# Patient Record
Sex: Male | Born: 1995 | Race: White | Hispanic: No | Marital: Single | Smoking: Never smoker
Health system: Southern US, Community
[De-identification: ages and names within clinical notes are randomized; demographics above are authoritative.]

## PROBLEM LIST (undated history)

## (undated) DIAGNOSIS — G43909 Migraine, unspecified, not intractable, without status migrainosus: Secondary | ICD-10-CM

---

## 2014-05-21 ENCOUNTER — Encounter (HOSPITAL_COMMUNITY): Payer: Self-pay | Admitting: Emergency Medicine

## 2014-05-21 ENCOUNTER — Emergency Department (HOSPITAL_COMMUNITY)
Admission: EM | Admit: 2014-05-21 | Discharge: 2014-05-21 | Disposition: A | Discharge status: Home / Self Care | Payer: Medicaid - Out of State | Attending: Emergency Medicine | Admitting: Emergency Medicine

## 2014-05-21 DIAGNOSIS — G43909 Migraine, unspecified, not intractable, without status migrainosus: Secondary | ICD-10-CM | POA: Insufficient documentation

## 2014-05-21 DIAGNOSIS — R519 Headache, unspecified: Secondary | ICD-10-CM

## 2014-05-21 DIAGNOSIS — R51 Headache: Secondary | ICD-10-CM

## 2014-05-21 HISTORY — DX: Migraine, unspecified, not intractable, without status migrainosus: G43.909

## 2014-05-21 MED ORDER — GI COCKTAIL ~~LOC~~
30.0000 mL | Freq: Once | ORAL | Status: AC
  Administered 2014-05-21: 30 mL via ORAL
  Filled 2014-05-21: qty 30

## 2014-05-21 MED ORDER — DEXAMETHASONE SODIUM PHOSPHATE 10 MG/ML IJ SOLN
10.0000 mg | Freq: Once | INTRAMUSCULAR | Status: AC
Start: 2014-05-21 — End: 2014-05-21
  Administered 2014-05-21: 10 mg via INTRAVENOUS
  Filled 2014-05-21: qty 1

## 2014-05-21 MED ORDER — SUMATRIPTAN SUCCINATE 50 MG PO TABS: 50.0000 mg | ORAL_TABLET | ORAL | Status: AC | PRN

## 2014-05-21 MED ORDER — GI COCKTAIL ~~LOC~~: 15.0000 mL | Freq: Three times a day (TID) | ORAL | Status: AC | PRN

## 2014-05-21 MED ORDER — SODIUM CHLORIDE 0.9 % IV BOLUS (SEPSIS)
1000.0000 mL | Freq: Once | INTRAVENOUS | Status: AC
Start: 2014-05-21 — End: 2014-05-21
  Administered 2014-05-21: 1000 mL via INTRAVENOUS

## 2014-05-21 MED ORDER — METOCLOPRAMIDE HCL 10 MG PO TABS: 10.0000 mg | ORAL_TABLET | Freq: Four times a day (QID) | ORAL | Status: AC

## 2014-05-21 MED ORDER — METOCLOPRAMIDE HCL 5 MG/ML IJ SOLN
10.0000 mg | Freq: Once | INTRAMUSCULAR | Status: AC
  Administered 2014-05-21: 10 mg via INTRAVENOUS
  Filled 2014-05-21: qty 2

## 2014-05-21 MED ORDER — DIPHENHYDRAMINE HCL 50 MG/ML IJ SOLN
25.0000 mg | Freq: Once | INTRAMUSCULAR | Status: AC
  Administered 2014-05-21: 25 mg via INTRAVENOUS
  Filled 2014-05-21: qty 1

## 2014-05-21 NOTE — Discharge Instructions (Signed)
Do not hesitate to return to the emergency room for any new, worsening or concerning symptoms.  Please obtain primary care using resource guide below. But the minute you were seen in the emergency room and that they will need to obtain records for further outpatient management.   General Headache Without Cause A headache is pain or discomfort felt around the head or neck area. The specific cause of a headache may not be found. There are many causes and types of headaches. A few common ones are:  Tension headaches.  Migraine headaches.  Cluster headaches.  Chronic daily headaches. HOME CARE INSTRUCTIONS   Keep all follow-up appointments with your caregiver or any specialist referral.  Only take over-the-counter or prescription medicines for pain or discomfort as directed by your caregiver.  Lie down in a dark, quiet room when you have a headache.  Keep a headache journal to find out what may trigger your migraine headaches. For example, write down:  What you eat and drink.  How much sleep you get.  Any change to your diet or medicines.  Try massage or other relaxation techniques.  Put ice packs or heat on the head and neck. Use these 3 to 4 times per day for 15 to 20 minutes each time, or as needed.  Limit stress.  Sit up straight, and do not tense your muscles.  Quit smoking if you smoke.  Limit alcohol use.  Decrease the amount of caffeine you drink, or stop drinking caffeine.  Eat and sleep on a regular schedule.  Get 7 to 9 hours of sleep, or as recommended by your caregiver.  Keep lights dim if bright lights bother you and make your headaches worse. SEEK MEDICAL CARE IF:   You have problems with the medicines you were prescribed.  Your medicines are not working.  You have a change from the usual headache.  You have nausea or vomiting. SEEK IMMEDIATE MEDICAL CARE IF:   Your headache becomes severe.  You have a fever.  You have a stiff neck.  You  have loss of vision.  You have muscular weakness or loss of muscle control.  You start losing your balance or have trouble walking.  You feel faint or pass out.  You have severe symptoms that are different from your first symptoms. MAKE SURE YOU:   Understand these instructions.  Will watch your condition.  Will get help right away if you are not doing well or get worse. Document Released: 03/23/2005 Document Revised: 06/15/2011 Document Reviewed: 04/08/2011 [facility] Patient Information 2015 [place], [place]. This information is not intended to replace advice given to you by your health care provider. Make sure you discuss any questions you have with your health care provider.  Emergency Department Resource Guide 1) Find a Doctor and Pay Out of Pocket Although you won't have to find out who is covered by your insurance plan, it is a good idea to ask around and get recommendations. You will then need to call the office and see if the doctor you have chosen will accept you as a new patient and what types of options they offer for patients who are self-pay. Some doctors offer discounts or will set up payment plans for their patients who do not have insurance, but you will need to ask so you aren't surprised when you get to your appointment.  2) Contact Your Local Health Department Not all health departments have doctors that can see patients for sick visits, but many do, so  it is worth a call to see if yours does. If you don't know where your local health department is, you can check in your phone book. The CDC also has a tool to help you locate your state's health department, and many state websites also have listings of all of their local health departments.  3) Find a Walk-in Clinic If your illness is not likely to be very severe or complicated, you may want to try a walk in clinic. These are popping up all over the country in pharmacies, drugstores, and shopping centers. They're usually  staffed by nurse practitioners or physician assistants that have been trained to treat common illnesses and complaints. They're usually fairly quick and inexpensive. However, if you have serious medical issues or chronic medical problems, these are probably not your best option.  No Primary Care Doctor: - Call Health Connect at  [phone] - they can help you locate a primary care doctor that  accepts your insurance, provides certain services, etc. - Physician Referral Service- [phone]  Chronic Pain Problems: Organization         Address  Phone   Notes  [facility]  [phone] Patients need to be referred by their primary care doctor.   Medication Assistance: Organization         Address  Phone   Notes  [facility] Medication [facility] [address] [place] [place], [place] [address] [phone] --Must be a resident of [facility] -- Must have NO insurance coverage whatsoever (no Medicaid/ Medicare, etc.) -- The pt. MUST have a primary care doctor that directs their care regularly and follows them in the community   [facility]ist  [phone]   [employer]  [phone]    Agencies that provide inexpensive medical care: Organization         Address  Phone   Notes  [facility]  [phone]   [facility]    [phone]   [facility] [address], [place] [address] [phone]   [facility] [address] [place] [phone]   Planned Parenthood    [phone]   [facility]    [phone]   Community Health and [facility]  [address], [place] Phone:  [phone], Fax:  [phone] Hours of Operation:  9 am - 6 pm, M-F.  Also accepts Medicaid/Medicare and self-pay.  [facility]  [address], [place] Phone: [phone], Fax: [phone]. Hours of  Operation:  8:30 am - 5:30 pm, M-F.  Also accepts Medicaid and self-pay.  [facility] [place] [address], [place] Phone: [phone]   [facility] [address] Natasha Bence [place], [place] [phone], Ext. [phone] Mondays & Thursdays: 7-9 AM.  First 15 patients are seen on a first come, first serve basis.    Medicaid-accepting [facility] Providers:  Organization         Address  Phone   Notes  [facility] [address], [place] [phone] Also accepts self-pay patients.  [facility] [address] Laurell Josephs [place], [place]  [phone]   [facility] [address], [place] [phone]   [facility] [address] [place] [phone]   Renaye Rakers [address], [place]   [phone] Only accepts [place] patients after they have their name applied to their card.   Self-Pay (no insurance) in [facility]:  Organization         Address  Phone   Notes  Sickle Cell Patients, [facility] [address] [place], [place] [phone]   [facility] [address] [place], [place] [phone]   [facility]  [address], [place] [phone]   [facility]/Dr. Osei-Bonsu  [address] [place] or [address], [place] [phone] Phone number for both [place] and [place] locations is the same.  [facility] [address], [place] [phone]   [facility] [address], [place] or [address] [phone]   [facility] [address] [place] [phone], phone; [phone], fax Sees patients 1st and 3rd Saturday of every month.  Must not qualify for public or private insurance (i.e. Medicaid, Medicare,  [place] Health Choice, Veterans' Benefits)  Household income should be no more than 200% of the poverty level The clinic cannot treat you if you are pregnant or think you are pregnant  Sexually transmitted diseases are not treated at the clinic.    Dental Care: Organization         Address  Phone  Notes  [facility] Department of [facility] [address] [place], [place] [phone] Accepts children up to age 42 who are enrolled in [place] or [place] Health Choice; pregnant women with a Medicaid card; and children who have applied for Medicaid or [place] Health Choice, but were declined, whose parents can pay a reduced fee at time of service.  [facility] Department of [facility]  [address], [place] [phone] Accepts children up to age 98 who are enrolled in [place] or [place] Health Choice; pregnant women with a Medicaid card; and children who have applied for Medicaid or [place] Health Choice, but were declined, whose parents can pay a reduced fee at time of service.  [facility]  [address] [place], [place] [phone] Patients are seen by appointment only. Walk-ins are not accepted. [facility] will see patients 4 years of age and older. Monday - Tuesday (8am-5pm) Most Wednesdays (8:30-5pm) $30 per visit, cash only  [facility]  [address], [facility] [phone] Patients are seen by appointment only. Walk-ins are not accepted. [facility] will see patients 7 years of age and older. One Wednesday Evening (Monthly: Volunteer Based).  $30 per visit, cash only  [employer]  [phone] for adults; Children under age 64, call Graduate Pediatric Dentistry at [phone]. Children aged 27-14, please call [phone] to request a pediatric application.  Dental services are provided in all areas of dental care including fillings, crowns and bridges,  complete and partial dentures, implants, gum treatment, root canals, and extractions. Preventive care is also provided. Treatment is provided to both adults and children. Patients are selected via a lottery and there is often a waiting list.   [facility] [address] [place]  [phone] [email or web address]   [facility] [address] [place], [place] [phone], Ext. [phone] Second and Fourth  Thursday of each month, opens at 6:30 AM; Clinic ends at 9 AM.  Patients are seen on a first-come first-served basis, and a limited number are seen during each clinic.   [facility]  [address], [place], [place] [phone]   Eligibility Requirements You must have lived in [place], [place], or [place] for at least the last three months.   You cannot be eligible for state or federal sponsored National Cityhealthcare insurance, including CIG[employer]Veterans Administration, [place]icaid, or [employer]icare.   You generally cannot be eligible for healthcare insurance through your employer.    How to apply: Eligibility screenings are held every Tuesday and Wednesday afternoon from 1:00 pm until 4:00 pm. You do not need an appointment for the interview!  [facility] [address], [place], [place] [phone]   [facility]  [phone]   [facility]  [phone]   [facility]  [phone]    Behavioral Health Resources in the Community: Intensive Outpatient Programs Organization         Address  Phone  Notes  [facility] [address], [place], [place] [phone]   [facility] [address], [place], [place] [phone]   [facility] [address], [place], [place]  [phone]   [facility] [address],  [place], [place] [phone] or [phone]   Substance Abuse Resources Organization          Address  Phone  Notes  [facility]  [phone]   [facility]  [phone]   [facility]  [phone]   [facility]  [phone]   Residential & Outpatient Substance Abuse Program  [phone]   Psychological Services Organization         Address  Phone  Notes  [facility]  [phone]- [phone]   [facility]  [phone]   [facility] [address], [place] [phone] or [phone]    Mobile Crisis Teams Organization         Address  Phone  Notes  [facility], Mobile Crisis Care Unit  [phone]   [facility]  [address] [place], [place] [phone]   Doristine LocksSharon DeEsch [address] [place] [phone]    Self-Help/Support Groups Organization         Address  Phone             Notes  [facility] [place] - variety of support groups  [phone] Call for more information  Narcotics Anonymous (NA), [facility] [address], [place]  2 meetings at this location   Statisticianesidential Treatment Programs Organization         Address  Phone  Notes  [facility] [address],    [place]  [phone]   [facility]  [address], [place] [phone], [place], [place] [phone]   [facility] [address], [place] [phone] Admissions: 8am-3pm M-F  [facility] [address]    [place], [place] [phone]   [facility] [address], [place], [place] [phone]   [facility] [address]  [place], [place] [phone]   [facility] - Intensive Outpatient [address], [place], [place] [phone]   [facility] ([facility] [phone]  [place], [place] [phone] or [phone]   [facility] ([facility]) [address] [place], [place]  [phone] Accepts Medicaid  [facility] [address]  [place] [phone] Substance Abuse/Addiction Treatment   [facility]al Health Resources Organization         Address  Phone  Notes  [facility]  [phone]) [phone]   Angie FavaJulie Brannon, PhD [address], [address] [place], [place]   [phone] or [phone]   [facility]   [address] [place] [address] [place], [place] [phone](336) [phone]   [facility] [address], [place], [place] [phone](336) [phone] Insurance/Medicaid/sponsorship through [facility]  [facility] [address]                                    [place], [place] [phone]) [phone] Therapy/tele-psych/case  [facility] [address]   [place], [place] [phone](336) [phone]    Dr. Lolly MustacheArfeen  [phone]   [facility]  [employer] [facility] 1) [address], [place] 2) [address], [place] 3)  [address], [place] [phone]   [facility] Child Abuse Hotline [phone] or [phone] (After Hours)

## 2014-05-21 NOTE — ED Provider Notes (Signed)
CSN: [id number]     Arrival date & time 05/21/14  1352 History   First MD Initiated Contact with Patient 05/21/14 1422     Chief Complaint  Patient presents with  . Headache     (Consider location/radiation/quality/duration/timing/severity/associated sxs/prior Treatment) HPI   Marc Alvarado is a 19 y.o. male complaining of headache exacerbation, states it's bilateral frontal, and associated with nausea. Patient has not seen a neurologist and has not had a formal diagnosis of migraine, he rates his pain at 9 out of 10, no exacerbating or alleviating factors identified. States that [facility], spent the night in the emergency room there were given Toradol, Fioricet and ibuprofen and he has a history of allergic reaction to ibuprofen in that it exacerbates his headache and also causes nausea. Pt denies fever, rash, confusion, cervicalgia, LOC/syncope, change in vision, emesis, numbness, weakness, dysarthria, ataxia, thunderclap onset, exacerbation with exertion or valsalva, exacerbation in morning, CP, SOB, abdominal pain.   Past Medical History  Diagnosis Date  . Migraine    History reviewed. No pertinent past surgical history. No family history on file. History  Substance Use Topics  . Smoking status: Never Smoker   . Smokeless tobacco: Not on file  . Alcohol Use: No    Review of Systems  10 systems reviewed and found to be negative, except as noted in the HPI.   Allergies  Ibuprofen  Home Medications   Prior to Admission medications   Medication Sig Start Date End Date Taking? Authorizing Provider  butalbital-acetaminophen-caffeine (FIORICET, ESGIC) 50-325-40 MG per tablet Take 1 tablet by mouth once.   Yes Historical Provider, MD  ibuprofen (ADVIL,MOTRIN) 800 MG tablet Take 800 mg by mouth once.   Yes Historical Provider, MD  montelukast (SINGULAIR) 10 MG tablet Take 10 mg by mouth at bedtime.   Yes Historical Provider, MD  SUMAtriptan (IMITREX) 100 MG  tablet Take 100 mg by mouth daily as needed for migraine or headache. May repeat in 2 hours if headache persists or recurs.   Yes Historical Provider, MD   BP 146/88 mmHg  Pulse 79  Temp(Src) 97.6 F (36.4 C) (Oral)  Resp 16  SpO2 100% Physical Exam  Constitutional: He is oriented to person, place, and time. He appears well-developed and well-nourished. No distress.  Somnolent (patient's boyfriend states that [facility])   HENT:  Head: Normocephalic and atraumatic.  Mouth/Throat: Oropharynx is clear and moist.  Eyes: Conjunctivae and EOM are normal. Pupils are equal, round, and reactive to light.  Neck: Normal range of motion.  Cardiovascular: Normal rate, regular rhythm and intact distal pulses.   Pulmonary/Chest: Effort normal and breath sounds normal. No stridor. No respiratory distress. He has no wheezes. He has no rales. He exhibits no tenderness.  Abdominal: Soft. Bowel sounds are normal. He exhibits no distension and no mass. There is no tenderness. There is no rebound and no guarding.  Musculoskeletal: Normal range of motion. He exhibits no edema.  Neurological: He is oriented to person, place, and time.  II-Visual fields grossly intact. III/IV/VI-Extraocular movements intact.  Pupils reactive bilaterally. V/VII-Smile symmetric, equal eyebrow raise,  facial sensation intact VIII- Hearing grossly intact IX/X-Normal gag XI-bilateral shoulder shrug XII-midline tongue extension Motor: 5/5 bilaterally with normal tone and bulk Cerebellar: Normal finger-to-nose  and normal heel-to-shin test.   Romberg negative Ambulates with a coordinated gait   Skin: Skin is warm.  Psychiatric: He has  a normal mood and affect.  Nursing note and vitals reviewed.   ED Course  Procedures (including critical care time) Labs Review Labs Reviewed - No data to display  Imaging Review No results found.   EKG Interpretation None      MDM   Final  diagnoses:  Acute nonintractable headache, unspecified headache type    Filed Vitals:   05/21/14 1405 05/21/14 1541  BP: 146/88 141/83  Pulse: 79 87  Temp: 97.6 F (36.4 C)   TempSrc: Oral   Resp: 16   SpO2: 100% 100%    Medications  sodium chloride 0.9 % bolus 1,000 mL (1,000 mLs Intravenous New Bag/Given 05/21/14 1438)  metoCLOPramide (REGLAN) injection 10 mg (10 mg Intravenous Given 05/21/14 1438)  diphenhydrAMINE (BENADRYL) injection 25 mg (25 mg Intravenous Given 05/21/14 1438)  dexamethasone (DECADRON) injection 10 mg (10 mg Intravenous Given 05/21/14 1500)    Marc Alvarado is a pleasant 19 y.o. male presenting with HA. Presentation is like pts typical HA and non concerning for [facility], ICH, Meningitis, or temporal arteritis. Pt is afebrile with no focal neuro deficits, nuchal rigidity, or change in vision. Pt is to follow up with PCP to discuss prophylactic medication. Pt verbalizes understanding and is agreeable with plan to dc.  Evaluation does not show pathology that would require ongoing emergent intervention or inpatient treatment. Pt is hemodynamically stable and mentating appropriately. Discussed findings and plan with patient/guardian, who agrees with care plan. All questions answered. Return precautions discussed and outpatient follow up given.   New Prescriptions   ALUM & MAG HYDROXIDE-SIMETH (GI COCKTAIL) SUSP SUSPENSION    Take 15 mLs by mouth 3 (three) times daily as needed for indigestion. Shake well.   METOCLOPRAMIDE (REGLAN) 10 MG TABLET    Take 1 tablet (10 mg total) by mouth every 6 (six) hours.   SUMATRIPTAN (IMITREX) 50 MG TABLET    Take 1 tablet (50 mg total) by mouth every 2 (two) hours as needed for migraine or headache (Up to a maximum of 200 mg per day). May repeat in 2 hours if headache persists or recurs.         Wynetta Emery, PA-C 05/21/14 1544  Suzi Roots, MD 05/22/14 [phone]

## 2014-05-21 NOTE — ED Notes (Signed)
Per pt, states migraine since yesterday-went to [facility] yesterday and was given Toradol, Fioricet, and ibuprofen which he says he is allergic to-states since taking Fioricet he has felt weird

## 2014-06-09 ENCOUNTER — Emergency Department (HOSPITAL_COMMUNITY): Payer: Medicaid - Out of State

## 2014-06-09 ENCOUNTER — Emergency Department (HOSPITAL_COMMUNITY)
Admission: EM | Admit: 2014-06-09 | Discharge: 2014-06-09 | Disposition: A | Discharge status: Home / Self Care | Payer: Medicaid - Out of State | Attending: Emergency Medicine | Admitting: Emergency Medicine

## 2014-06-09 ENCOUNTER — Encounter (HOSPITAL_COMMUNITY): Payer: Self-pay | Admitting: Emergency Medicine

## 2014-06-09 DIAGNOSIS — S99921A Unspecified injury of right foot, initial encounter: Secondary | ICD-10-CM | POA: Diagnosis present

## 2014-06-09 DIAGNOSIS — S93601A Unspecified sprain of right foot, initial encounter: Secondary | ICD-10-CM | POA: Insufficient documentation

## 2014-06-09 DIAGNOSIS — W06XXXA Fall from bed, initial encounter: Secondary | ICD-10-CM | POA: Diagnosis not present

## 2014-06-09 DIAGNOSIS — G43909 Migraine, unspecified, not intractable, without status migrainosus: Secondary | ICD-10-CM | POA: Insufficient documentation

## 2014-06-09 DIAGNOSIS — Y9289 Other specified places as the place of occurrence of the external cause: Secondary | ICD-10-CM | POA: Insufficient documentation

## 2014-06-09 DIAGNOSIS — Y998 Other external cause status: Secondary | ICD-10-CM | POA: Diagnosis not present

## 2014-06-09 DIAGNOSIS — Y9389 Activity, other specified: Secondary | ICD-10-CM | POA: Insufficient documentation

## 2014-06-09 MED ORDER — NAPROXEN 500 MG PO TABS: 500.0000 mg | ORAL_TABLET | Freq: Two times a day (BID) | ORAL | Status: AC

## 2014-06-09 MED ORDER — METHOCARBAMOL 500 MG PO TABS: 500.0000 mg | ORAL_TABLET | Freq: Three times a day (TID) | ORAL | Status: AC | PRN

## 2014-06-09 NOTE — Discharge Instructions (Signed)
Foot Sprain The muscles and cord like structures which attach muscle to bone (tendons) that surround the feet are made up of units. A foot sprain can occur at the weakest spot in any of these units. This condition is most often caused by injury to or overuse of the foot, as from playing contact sports, or aggravating a previous injury, or from poor conditioning, or obesity. SYMPTOMS  Pain with movement of the foot.  Tenderness and swelling at the injury site.  Loss of strength is present in moderate or severe sprains. THE THREE GRADES OR SEVERITY OF FOOT SPRAIN ARE:  Mild (Grade I): Slightly pulled muscle without tearing of muscle or tendon fibers or loss of strength.  Moderate (Grade II): Tearing of fibers in a muscle, tendon, or at the attachment to bone, with small decrease in strength.  Severe (Grade III): Rupture of the muscle-tendon-bone attachment, with separation of fibers. Severe sprain requires surgical repair. Often repeating (chronic) sprains are caused by overuse. Sudden (acute) sprains are caused by direct injury or over-use. DIAGNOSIS  Diagnosis of this condition is usually by your own observation. If problems continue, a caregiver may be required for further evaluation and treatment. X-rays may be required to make sure there are not breaks in the bones (fractures) present. Continued problems may require physical therapy for treatment. PREVENTION  Use strength and conditioning exercises appropriate for your sport.  Warm up properly prior to working out.  Use athletic shoes that are made for the sport you are participating in.  Allow adequate time for healing. Early return to activities makes repeat injury more likely, and can lead to an unstable arthritic foot that can result in prolonged disability. Mild sprains generally heal in 3 to 10 days, with moderate and severe sprains taking 2 to 10 weeks. Your caregiver can help you determine the proper time required for  healing. HOME CARE INSTRUCTIONS   Apply ice to the injury for 15-20 minutes, 03-04 times per day. Put the ice in a plastic bag and place a towel between the bag of ice and your skin.  An elastic wrap (like an Ace bandage) may be used to keep swelling down.  Keep foot above the level of the heart, or at least raised on a footstool, when swelling and pain are present.  Try to avoid use other than gentle range of motion while the foot is painful. Do not resume use until instructed by your caregiver. Then begin use gradually, not increasing use to the point of pain. If pain does develop, decrease use and continue the above measures, gradually increasing activities that do not cause discomfort, until you gradually achieve normal use.  Use crutches if and as instructed, and for the length of time instructed.  Keep injured foot and ankle wrapped between treatments.  Massage foot and ankle for comfort and to keep swelling down. Massage from the toes up towards the knee.  Only take over-the-counter or prescription medicines for pain, discomfort, or fever as directed by your caregiver. SEEK IMMEDIATE MEDICAL CARE IF:   Your pain and swelling increase, or pain is not controlled with medications.  You have loss of feeling in your foot or your foot turns cold or blue.  You develop new, unexplained symptoms, or an increase of the symptoms that brought you to your caregiver. MAKE SURE YOU:   Understand these instructions.  Will watch your condition.  Will get help right away if you are not doing well or get worse. Document Released:   09/12/2001 Document Revised: 06/15/2011 Document Reviewed: 11/10/2007 [employer] Patient Information 2015 [employer]. This information is not intended to replace advice given to you by your health care provider. Make sure you discuss any questions you have with your health care provider.  

## 2014-06-09 NOTE — ED Notes (Addendum)
Pt states that about week ago pt was getting out of his bed when he fell and hurt his right foot. Pt ambulated with steady gait to triage room.  Pt's partner states that he gave pt Prednisone 40mg  and MS Contin earlier for pain and inflammation but still hurting.

## 2014-06-09 NOTE — ED Provider Notes (Signed)
CSN: [id number]     Arrival date & time 06/09/14  1812 History   First MD Initiated Contact with Patient 06/09/14 1916     This chart was scribed for non-physician practitioner, Fayrene Helper PA-C working with Flint Melter, MD by Arlan Organ, ED Scribe. This patient was seen in room WTR7/WTR7 and the patient's care was started at 7:32 PM.   Chief Complaint  Patient presents with  . Foot Pain   The history is provided by the patient. No language interpreter was used.    HPI Comments: Marc Alvarado is a 19 y.o. male who presents to the Emergency Department complaining of constant, moderate R foot pain x 4 days that has progressively worsened. Pt states he was attempting to get out of his bed when he fell to the ground. Pain is exacerbated with weight bearing and ambulation. He has tried a friends Prednisone 40 mg, Mobic, Aleve, and MS Contin without any improvement for pain. No numbness, loss of sensation, or paresthesia. Pt is otherwise healthy without any medical problems. Pt with known allergy to Ibuprofen.  Past Medical History  Diagnosis Date  . Migraine    History reviewed. No pertinent past surgical history. No family history on file. History  Substance Use Topics  . Smoking status: Never Smoker   . Smokeless tobacco: Not on file  . Alcohol Use: No    Review of Systems  Musculoskeletal: Positive for arthralgias.  Neurological: Negative for weakness and numbness.      Allergies  Ibuprofen  Home Medications   Prior to Admission medications   Medication Sig Start Date End Date Taking? Authorizing Provider  Alum & Mag Hydroxide-Simeth (GI COCKTAIL) SUSP suspension Take 15 mLs by mouth 3 (three) times daily as needed for indigestion. Shake well. 05/21/14   Nicole Pisciotta, PA-C  butalbital-acetaminophen-caffeine (FIORICET, ESGIC) 50-325-40 MG per tablet Take 1 tablet by mouth once.    Historical Provider, MD  ibuprofen (ADVIL,MOTRIN) 800 MG tablet Take 800 mg by mouth  once.    Historical Provider, MD  metoCLOPramide (REGLAN) 10 MG tablet Take 1 tablet (10 mg total) by mouth every 6 (six) hours. 05/21/14   Nicole Pisciotta, PA-C  montelukast (SINGULAIR) 10 MG tablet Take 10 mg by mouth at bedtime.    Historical Provider, MD  SUMAtriptan (IMITREX) 50 MG tablet Take 1 tablet (50 mg total) by mouth every 2 (two) hours as needed for migraine or headache (Up to a maximum of 200 mg per day). May repeat in 2 hours if headache persists or recurs. 05/21/14   Nicole Pisciotta, PA-C   Triage Vitals: BP 149/84 mmHg  Pulse 110  Temp(Src) 98.8 F (37.1 C) (Oral)  Resp 18  Ht 6' (1.829 m)  Wt 165 lb (74.844 kg)  BMI 22.37 kg/m2  SpO2 97%   Physical Exam  Constitutional: He is oriented to person, place, and time. He appears well-developed and well-nourished.  HENT:  Head: Normocephalic.  Eyes: EOM are normal.  Neck: Normal range of motion.  Cardiovascular:  R pedal pulse intact  Pulmonary/Chest: Effort normal.  Abdominal: He exhibits no distension.  Musculoskeletal: Normal range of motion.  Tenderness to palpation over medial arch of R foot R ankle with no pain No pain to posterior, lateral, or medial malleolus of R ankle No foreign objects visualized  Neurological: He is alert and oriented to person, place, and time.  Psychiatric: He has a normal mood and affect.  Nursing note and vitals reviewed.  ED Course  Procedures (including critical care time)  DIAGNOSTIC STUDIES: Oxygen Saturation is 97% on RA, Normal by my interpretation.    COORDINATION OF CARE: 7:44 PM- Will order imaging. Discussed treatment plan with pt at bedside and pt agreed to plan.    8:36 PM Xray negative.  ACE wrap and postop shoe given for comfort.  RICE therapy discussed.  Return precaution given.   Labs Review Labs Reviewed - No data to display  Imaging Review Dg Ankle Complete Right  06/09/2014   CLINICAL DATA:  Lateral right ankle pain initial evaluation, fell 1 week ago  getting out of bed  EXAM: RIGHT ANKLE - COMPLETE 3+ VIEW  COMPARISON:  None.  FINDINGS: There is no evidence of fracture, dislocation, or joint effusion. There is no evidence of arthropathy or other focal bone abnormality. Soft tissues are unremarkable.  IMPRESSION: Negative.   Electronically Signed   By: Esperanza Heiraymond  Rubner M.D.   On: 06/09/2014 20:30   Dg Foot Complete Right  06/09/2014   CLINICAL DATA:  Fall with right foot injury 1 week ago with continued lateral pain. Initial encounter.  EXAM: RIGHT FOOT COMPLETE - 3+ VIEW  COMPARISON:  None.  FINDINGS: There is no evidence of fracture or dislocation. There is no evidence of arthropathy or other focal bone abnormality. Soft tissues are unremarkable.  IMPRESSION: Negative.   Electronically Signed   By: Harmon PierJeffrey  Hu M.D.   On: 06/09/2014 20:32     EKG Interpretation None      MDM   Final diagnoses:  Right foot sprain, initial encounter    BP 149/84 mmHg  Pulse 110  Temp(Src) 98.8 F (37.1 C) (Oral)  Resp 18  Ht 6' (1.829 m)  Wt 165 lb (74.844 kg)  BMI 22.37 kg/m2  SpO2 97% Tachycardiac 2/2 pain.    I have reviewed nursing notes and vital signs. I personally reviewed the imaging tests through PACS system  I reviewed available ER/hospitalization records thought the EMR  I personally performed the services described in this documentation, which was scribed in my presence. The recorded information has been reviewed and is accurate.    Fayrene HelperBowie Abisai Deer, PA-C 06/09/14 2036  Flint MelterElliott L Wentz, MD 06/09/14 [phone]

## 2016-01-20 IMAGING — CR DG ANKLE COMPLETE 3+V*R*
3 series · 3 of 3 positions shown · non-contrast
Comparison: None.

CLINICAL DATA: Lateral right ankle pain initial evaluation, fell 1
week ago getting out of bed

EXAM:
RIGHT ANKLE - COMPLETE 3+ VIEW

[x ankle ap right]
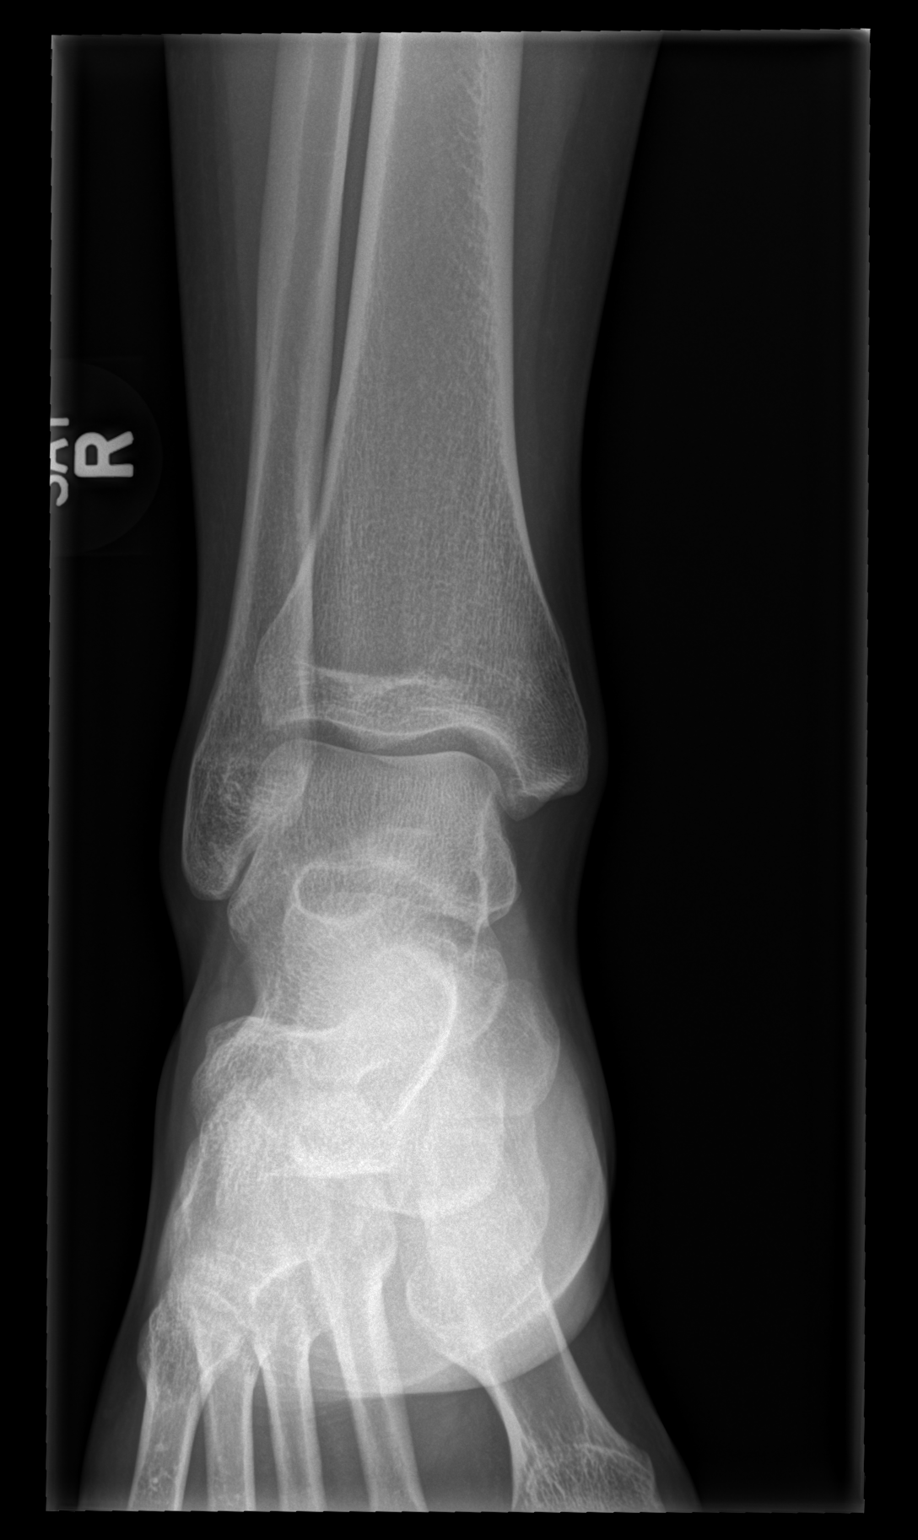

[x ankle obl right]
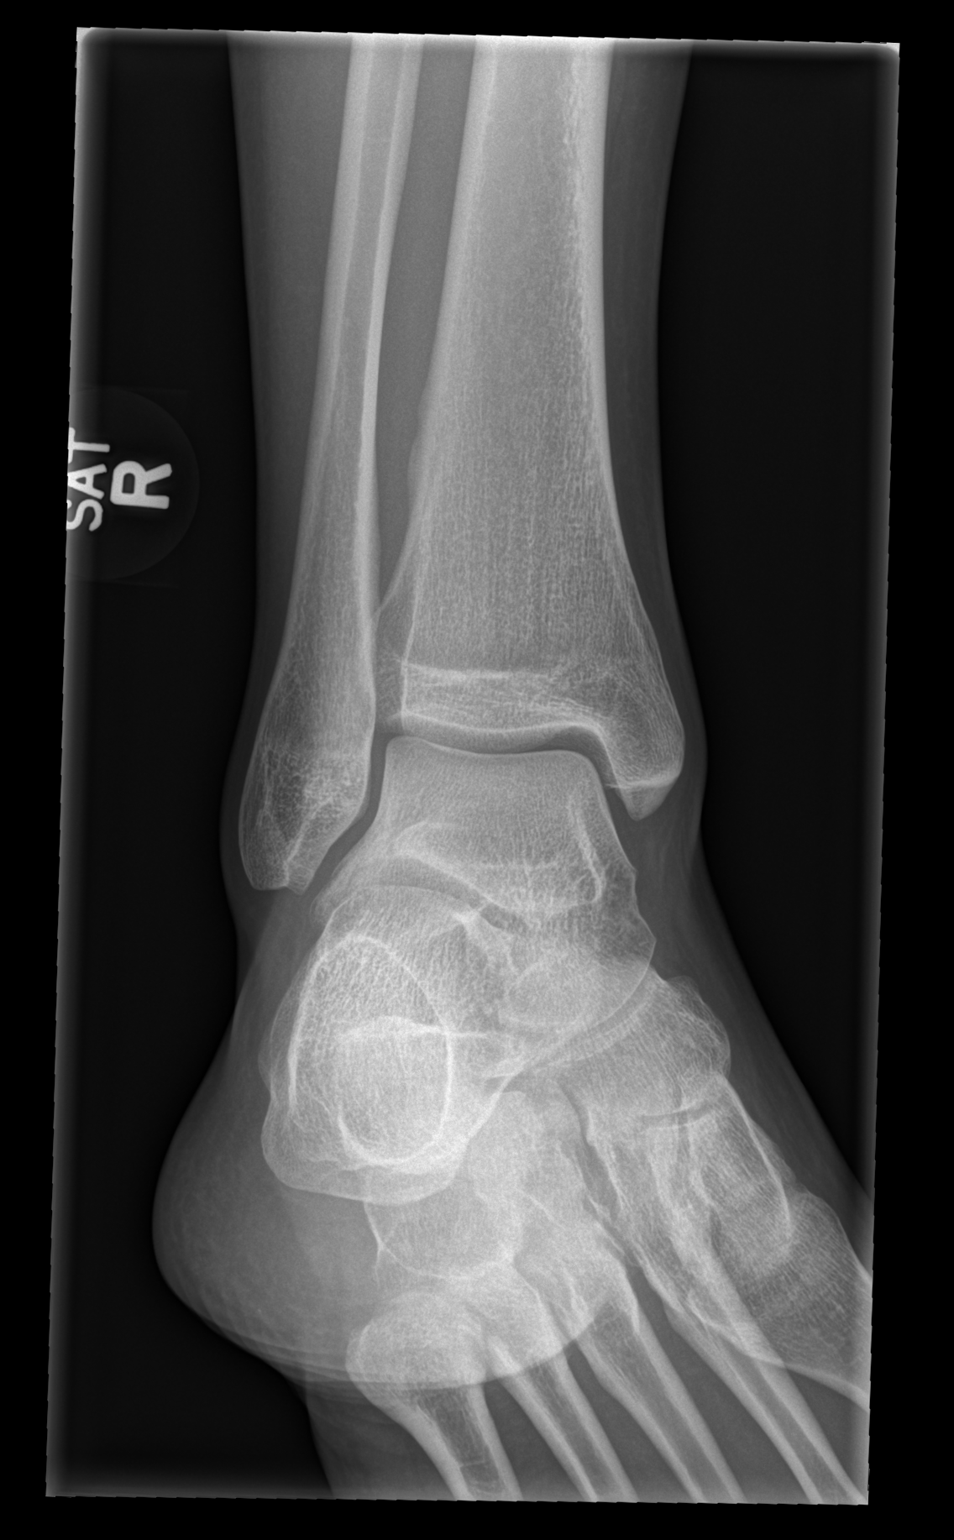

[x ankle lat right]
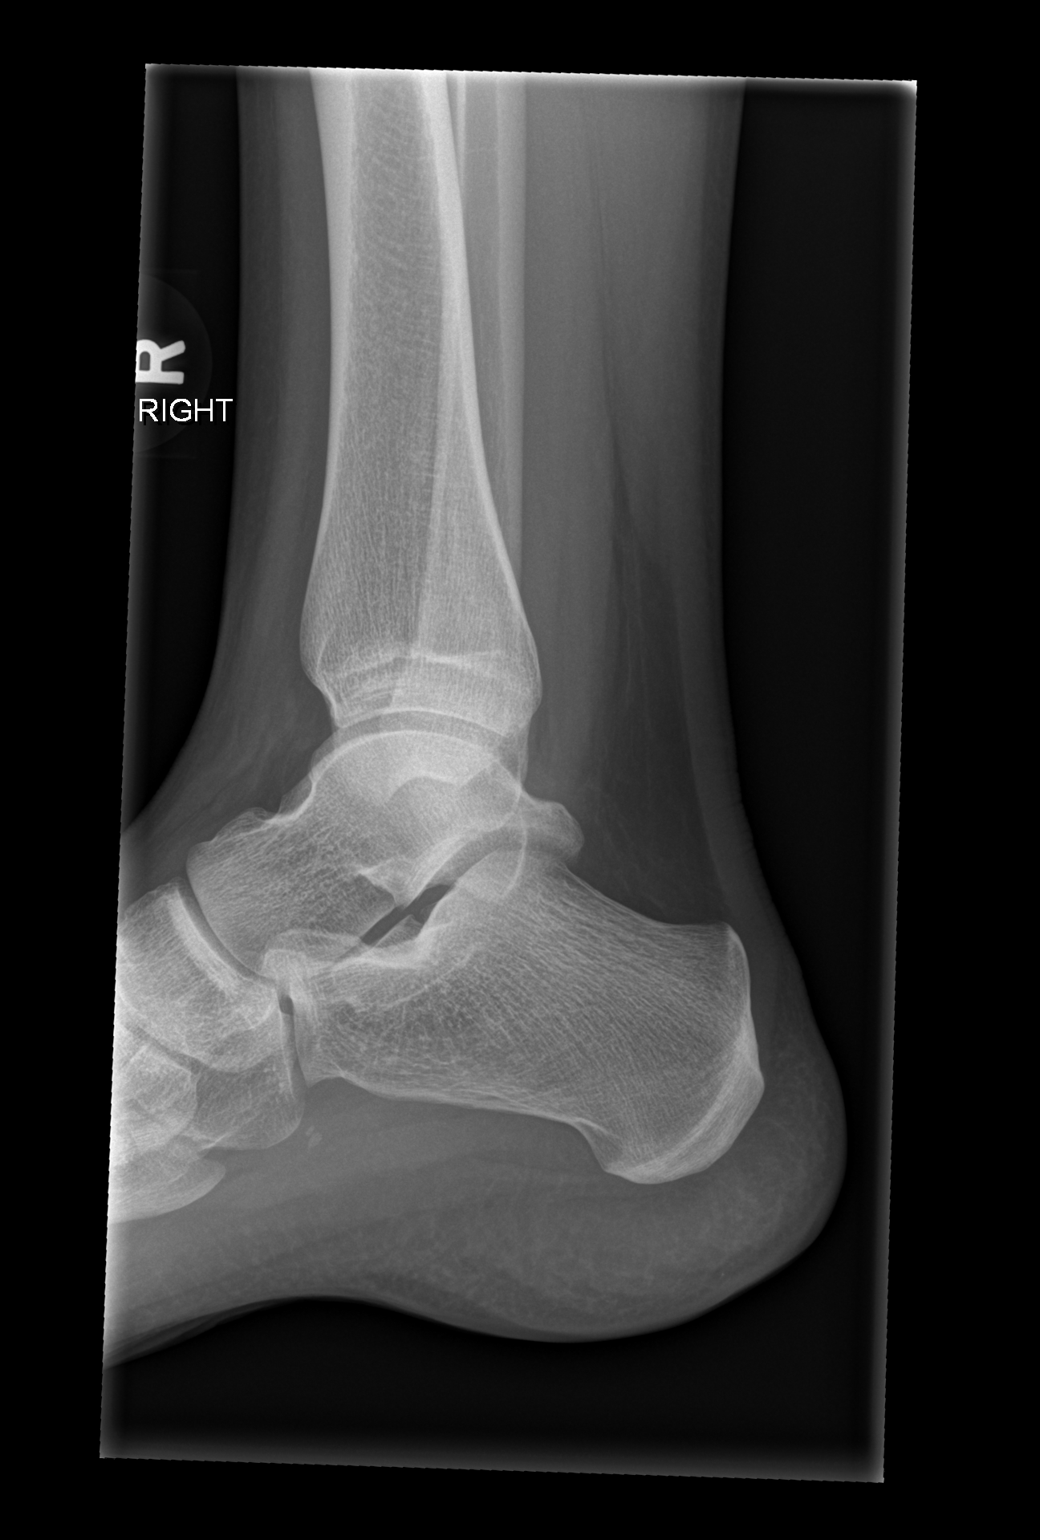

[3 of 3 positions shown; findings below may reference images not displayed]

FINDINGS: There is no evidence of fracture, dislocation, or joint effusion.
There is no evidence of arthropathy or other focal bone abnormality.
Soft tissues are unremarkable.
IMPRESSION: Negative.
